# Patient Record
Sex: Female | Born: 1955 | Race: Black or African American | Hispanic: No | Marital: Single | Smoking: Never smoker
Health system: Southern US, Community
[De-identification: ages and names within clinical notes are randomized; demographics above are authoritative.]

## PROBLEM LIST (undated history)

## (undated) DIAGNOSIS — K219 Gastro-esophageal reflux disease without esophagitis: Secondary | ICD-10-CM

## (undated) DIAGNOSIS — E119 Type 2 diabetes mellitus without complications: Secondary | ICD-10-CM

## (undated) DIAGNOSIS — I10 Essential (primary) hypertension: Secondary | ICD-10-CM

## (undated) DIAGNOSIS — D649 Anemia, unspecified: Secondary | ICD-10-CM

## (undated) DIAGNOSIS — E7800 Pure hypercholesterolemia, unspecified: Secondary | ICD-10-CM

## (undated) HISTORY — PX: CHOLECYSTECTOMY: SHX55

## (undated) HISTORY — PX: BREAST EXCISIONAL BIOPSY: SUR124

## (undated) HISTORY — PX: ABDOMINAL HYSTERECTOMY: SHX81

---

## 2009-03-11 HISTORY — PX: GASTRIC BYPASS: SHX52

## 2011-01-06 DIAGNOSIS — K219 Gastro-esophageal reflux disease without esophagitis: Secondary | ICD-10-CM

## 2011-08-11 DIAGNOSIS — D649 Anemia, unspecified: Secondary | ICD-10-CM | POA: Insufficient documentation

## 2011-08-11 DIAGNOSIS — N289 Disorder of kidney and ureter, unspecified: Secondary | ICD-10-CM | POA: Insufficient documentation

## 2011-11-14 DIAGNOSIS — M549 Dorsalgia, unspecified: Secondary | ICD-10-CM | POA: Insufficient documentation

## 2011-11-14 DIAGNOSIS — R200 Anesthesia of skin: Secondary | ICD-10-CM | POA: Insufficient documentation

## 2012-10-09 ENCOUNTER — Ambulatory Visit: Payer: Self-pay | Admitting: Family Medicine

## 2013-04-17 DIAGNOSIS — I10 Essential (primary) hypertension: Secondary | ICD-10-CM | POA: Insufficient documentation

## 2013-04-17 DIAGNOSIS — M6530 Trigger finger, unspecified finger: Secondary | ICD-10-CM | POA: Insufficient documentation

## 2013-10-24 ENCOUNTER — Ambulatory Visit: Payer: Self-pay | Admitting: Physician Assistant

## 2013-11-29 DIAGNOSIS — E119 Type 2 diabetes mellitus without complications: Secondary | ICD-10-CM | POA: Insufficient documentation

## 2014-11-25 ENCOUNTER — Other Ambulatory Visit: Payer: Self-pay | Admitting: Family Medicine

## 2014-11-25 DIAGNOSIS — Z1231 Encounter for screening mammogram for malignant neoplasm of breast: Secondary | ICD-10-CM

## 2014-11-26 ENCOUNTER — Ambulatory Visit
Admission: RE | Admit: 2014-11-26 | Discharge: 2014-11-26 | Disposition: A | Discharge status: Home / Self Care | Payer: BC Managed Care – PPO | Source: Ambulatory Visit | Attending: Family Medicine | Admitting: Family Medicine

## 2014-11-26 DIAGNOSIS — Z1231 Encounter for screening mammogram for malignant neoplasm of breast: Secondary | ICD-10-CM

## 2015-02-17 ENCOUNTER — Emergency Department
Admission: EM | Admit: 2015-02-17 | Discharge: 2015-02-18 | Disposition: A | Discharge status: Home / Self Care | Payer: BC Managed Care – PPO | Attending: Emergency Medicine | Admitting: Emergency Medicine

## 2015-02-17 ENCOUNTER — Encounter: Payer: Self-pay | Admitting: *Deleted

## 2015-02-17 DIAGNOSIS — I10 Essential (primary) hypertension: Secondary | ICD-10-CM | POA: Diagnosis not present

## 2015-02-17 DIAGNOSIS — K529 Noninfective gastroenteritis and colitis, unspecified: Secondary | ICD-10-CM

## 2015-02-17 DIAGNOSIS — Z880 Allergy status to penicillin: Secondary | ICD-10-CM | POA: Diagnosis not present

## 2015-02-17 DIAGNOSIS — R197 Diarrhea, unspecified: Secondary | ICD-10-CM | POA: Diagnosis present

## 2015-02-17 LAB — CBC
HCT: 40.9 % (ref 35.0–47.0)
HEMOGLOBIN: 13.3 g/dL (ref 12.0–16.0)
MCH: 26.8 pg (ref 26.0–34.0)
MCHC: 32.6 g/dL (ref 32.0–36.0)
MCV: 82.1 fL (ref 80.0–100.0)
Platelets: 362 10*3/uL (ref 150–440)
RBC: 4.98 MIL/uL (ref 3.80–5.20)
RDW: 13.5 % (ref 11.5–14.5)
WBC: 10.7 10*3/uL (ref 3.6–11.0)

## 2015-02-17 NOTE — ED Notes (Signed)
Pt reports diarrhea for 1 week.  Denies abd pain.  Pt also reports n/v today.  Seen at [facility] today and was started on zithromax and zofran tonight.  Pt states she is worse tonight after going to urgent care today.

## 2015-02-18 LAB — LIPASE, BLOOD: Lipase: 37 U/L (ref 11–51)

## 2015-02-18 LAB — COMPREHENSIVE METABOLIC PANEL
ALK PHOS: 84 U/L (ref 38–126)
ALT: 15 U/L (ref 14–54)
ANION GAP: 8 (ref 5–15)
AST: 20 U/L (ref 15–41)
Albumin: 4.8 g/dL (ref 3.5–5.0)
BILIRUBIN TOTAL: 0.9 mg/dL (ref 0.3–1.2)
BUN: 47 mg/dL — ABNORMAL HIGH (ref 6–20)
CALCIUM: 9.5 mg/dL (ref 8.9–10.3)
CO2: 17 mmol/L — ABNORMAL LOW (ref 22–32)
Chloride: 110 mmol/L (ref 101–111)
Creatinine, Ser: 1.99 mg/dL — ABNORMAL HIGH (ref 0.44–1.00)
GFR, EST AFRICAN AMERICAN: 31 mL/min — AB (ref >60)
GFR, EST NON AFRICAN AMERICAN: 27 mL/min — AB (ref >60)
Glucose, Bld: 233 mg/dL — ABNORMAL HIGH (ref 65–99)
Potassium: 3.7 mmol/L (ref 3.5–5.1)
SODIUM: 135 mmol/L (ref 135–145)
TOTAL PROTEIN: 8.8 g/dL — AB (ref 6.5–8.1)

## 2015-02-18 LAB — C DIFFICILE QUICK SCREEN W PCR REFLEX
C DIFFICLE (CDIFF) ANTIGEN: NEGATIVE
C Diff interpretation: NEGATIVE
C Diff toxin: NEGATIVE

## 2015-02-18 MED ORDER — METRONIDAZOLE 500 MG PO TABS
500.0000 mg | ORAL_TABLET | Freq: Once | ORAL | Status: AC
  Administered 2015-02-18: 500 mg via ORAL
  Filled 2015-02-18: qty 1

## 2015-02-18 MED ORDER — SODIUM CHLORIDE 0.9 % IV BOLUS (SEPSIS)
1000.0000 mL | Freq: Once | INTRAVENOUS | Status: AC
  Administered 2015-02-18: 1000 mL via INTRAVENOUS

## 2015-02-18 MED ORDER — CIPROFLOXACIN HCL 500 MG PO TABS
500.0000 mg | ORAL_TABLET | Freq: Once | ORAL | Status: AC
  Administered 2015-02-18: 500 mg via ORAL
  Filled 2015-02-18: qty 1

## 2015-02-18 MED ORDER — CIPROFLOXACIN HCL 500 MG PO TABS
500.0000 mg | ORAL_TABLET | Freq: Two times a day (BID) | ORAL | Status: DC
Start: 2015-02-18 — End: 2015-06-22

## 2015-02-18 MED ORDER — ONDANSETRON HCL 4 MG/2ML IJ SOLN
4.0000 mg | Freq: Once | INTRAMUSCULAR | Status: AC
  Administered 2015-02-18: 4 mg via INTRAVENOUS
  Filled 2015-02-18: qty 2

## 2015-02-18 MED ORDER — PROMETHAZINE HCL 12.5 MG PO TABS: 12.5000 mg | ORAL_TABLET | Freq: Four times a day (QID) | ORAL | Status: DC | PRN

## 2015-02-18 MED ORDER — METRONIDAZOLE 500 MG PO TABS: 500.0000 mg | ORAL_TABLET | Freq: Two times a day (BID) | ORAL | Status: DC

## 2015-02-18 NOTE — Discharge Instructions (Signed)
°  Colitis °Colitis is inflammation of the colon. Colitis may last a short time (acute) or it may last a long time (chronic). °CAUSES °This condition may be caused by: °· Viruses. °· Bacteria. °· Reactions to medicine. °· Certain autoimmune diseases, such as Crohn disease or ulcerative colitis. °SYMPTOMS °Symptoms of this condition include: °· Diarrhea. °· Passing bloody or tarry stool. °· Pain. °· Fever. °· Vomiting. °· Tiredness (fatigue). °· Weight loss. °· Bloating. °· Sudden increase in abdominal pain. °· Having fewer bowel movements than usual. °DIAGNOSIS °This condition is diagnosed with a stool test or a blood test. You may also have other tests, including X-rays, a CT scan, or a colonoscopy. °TREATMENT °Treatment may include: °· Resting the bowel. This involves not eating or drinking for a period of time. °· Fluids that are given through an IV tube. °· Medicine for pain and diarrhea. °· Antibiotic medicines. °· Cortisone medicines. °· Surgery. °HOME CARE INSTRUCTIONS °Eating and Drinking °· Follow instructions from your health care provider about eating or drinking restrictions. °· Drink enough fluid to keep your urine clear or pale yellow. °· Work with a dietitian to determine which foods cause your condition to flare up. °· Avoid foods that cause flare-ups. °· Eat a well-balanced diet. °Medicines °· Take over-the-counter and prescription medicines only as told by your health care provider. °· If you were prescribed an antibiotic medicine, take it as told by your health care provider. Do not stop taking the antibiotic even if you start to feel better. °General Instructions °· Keep all follow-up visits as told by your health care provider. This is important. °SEEK MEDICAL CARE IF: °· Your symptoms do not go away. °· You develop new symptoms. °SEEK IMMEDIATE MEDICAL CARE IF: °· You have a fever that does not go away with treatment. °· You develop chills. °· You have extreme weakness, fainting, or  dehydration. °· You have repeated vomiting. °· You develop severe pain in your abdomen. °· You pass bloody or tarry stool. °  °This information is not intended to replace advice given to you by your health care provider. Make sure you discuss any questions you have with your health care provider. °  °Document Released: 05/05/2004 Document Revised: 12/17/2014 Document Reviewed: 07/21/2014 °Elsevier Interactive Patient Education ©2016 [employer] ° °

## 2015-02-18 NOTE — ED Provider Notes (Signed)
[facility] Emergency Department Provider Note  ____________________________________________  Time seen: 12:10 AM  I have reviewed the triage vital signs and the nursing notes.   HISTORY  Chief Complaint Diarrhea      HPI Kristie Miller is a 59 y.o. female presents with nonbloody diarrhea times one week accompanied by nausea and vomiting today. Patient was seen at [facility] today and prescribed azithromycin and Zofran. Patient unclear why she was given an antibiotic. Patient states that symptoms worsened tonight resulting in her presenting to the emergency department.     Past medical history Gastric bypass surgery Hypertension There are no active problems to display for this patient.   Past Surgical History  Procedure Laterality Date  . Breast biopsy Right 2009 or 2010    neg    No current outpatient prescriptions on file.  Allergies Penicillins  No family history on file.  Social History Social History  Substance Use Topics  . Smoking status: Never Smoker   . Smokeless tobacco: Not on file  . Alcohol Use: Yes    Review of Systems  Constitutional: Negative for fever. Eyes: Negative for visual changes. ENT: Negative for sore throat. Cardiovascular: Negative for chest pain. Respiratory: Negative for shortness of breath. Gastrointestinal: Negative for abdominal pain, vomiting and diarrhea. Genitourinary: Negative for dysuria. Musculoskeletal: Negative for back pain. Skin: Negative for rash. Neurological: Negative for headaches, focal weakness or numbness.   10-point ROS otherwise negative.  ____________________________________________   PHYSICAL EXAM:  VITAL SIGNS: ED Triage Vitals  Enc Vitals Group     BP 02/17/15 2306 120/67 mmHg     Pulse Rate 02/17/15 2306 128     Resp 02/17/15 2306 20     Temp 02/17/15 2306 98.1 F (36.7 C)     Temp Source 02/17/15 2306 Oral     SpO2 02/17/15 2306 100 %     Weight  02/17/15 2306 161 lb (73.029 kg)     Height 02/17/15 2306  (1.626 m)     Head Cir --      Peak Flow --      Pain Score 02/17/15 2308 8     Pain Loc --      Pain Edu? --      Excl. in GC? --      Constitutional: Alert and oriented. Well appearing and in no distress. Eyes: Conjunctivae are normal. PERRL. Normal extraocular movements. ENT   Head: Normocephalic and atraumatic.   Nose: No congestion/rhinnorhea.   Mouth/Throat: Mucous membranes are moist.   Neck: No stridor. Cardiovascular: Normal rate, regular rhythm. Normal and symmetric distal pulses are present in all extremities. No murmurs, rubs, or gallops. Respiratory: Normal respiratory effort without tachypnea nor retractions. Breath sounds are clear and equal bilaterally. No wheezes/rales/rhonchi. Gastrointestinal: Soft and nontender. No distention. There is no CVA tenderness. Genitourinary: deferred Musculoskeletal: Nontender with normal range of motion in all extremities. No joint effusions.  No lower extremity tenderness nor edema. Neurologic:  Normal speech and language. No gross focal neurologic deficits are appreciated. Speech is normal.  Skin:  Skin is warm, dry and intact. No rash noted. Psychiatric: Mood and affect are normal. Speech and behavior are normal. Patient exhibits appropriate insight and judgment.     INITIAL IMPRESSION / ASSESSMENT AND PLAN / ED COURSE  Pertinent labs & imaging results that were available during my care of the patient were reviewed by me and considered in my medical decision making (see chart for details).  Given history and physical exam concern for bacterial etiology of the patient's gastroenteritis given prolonged course. Patient received 2 L of IV normal saline here in the emergency department as she was clinically dehydrated which was confirmed with creatinine of 1.99 with a BUN of 47.  ____________________________________________   FINAL CLINICAL IMPRESSION(S) /  ED DIAGNOSES  Final diagnoses:  Acute gastroenteritis      Darci Currentandolph N Albena Comes, MD 02/18/15 [phone]

## 2015-02-18 NOTE — ED Notes (Signed)
Dr. Brown at bedside

## 2015-02-18 NOTE — ED Notes (Signed)
Discharge teaching done with patient.  Pt verbalized understanding.  No questions or concerns at this time.  Pt in NAD.  No nausea at this time.  Pt took belongings upon discharge.  No items left in ED.

## 2015-05-11 ENCOUNTER — Telehealth: Payer: Self-pay | Admitting: Gastroenterology

## 2015-05-11 NOTE — Telephone Encounter (Signed)
colonoscopy

## 2015-05-22 NOTE — Telephone Encounter (Signed)
LVM for pt to return my call.

## 2015-05-25 ENCOUNTER — Telehealth: Payer: Self-pay | Admitting: Gastroenterology

## 2015-05-25 NOTE — Telephone Encounter (Signed)
Triage and use extension [id number] with the number

## 2015-05-29 ENCOUNTER — Telehealth: Payer: Self-pay

## 2015-05-29 ENCOUNTER — Other Ambulatory Visit: Payer: Self-pay

## 2015-05-29 DIAGNOSIS — E785 Hyperlipidemia, unspecified: Secondary | ICD-10-CM | POA: Insufficient documentation

## 2015-05-29 NOTE — Telephone Encounter (Signed)
Gastroenterology Pre-Procedure Review  Request Date: 06/29/15 Requesting Physician: Dr. Fabienne Bruns  PATIENT REVIEW QUESTIONS: The patient responded to the following health history questions as indicated:    1. Are you having any GI issues? no 2. Do you have a personal history of Polyps? no 3. Do you have a family history of Colon Cancer or Polyps? no 4. Diabetes Mellitus? yes (Type 2) 5. Joint replacements in the past 12 months?no 6. Major health problems in the past 3 months?no 7. Any artificial heart valves, MVP, or defibrillator?no    MEDICATIONS & ALLERGIES:    Patient reports the following regarding taking any anticoagulation/antiplatelet therapy:   Plavix, Coumadin, Eliquis, Xarelto, Lovenox, Pradaxa, Brilinta, or Effient? no Aspirin? no  Patient confirms/reports the following medications:  Current Outpatient Prescriptions  Medication Sig Dispense Refill  . azithromycin (ZITHROMAX) 500 MG tablet Take 2 tablets by mouth daily.    . benazepril (LOTENSIN) 40 MG tablet Take 1 tablet by mouth daily.    . ciprofloxacin (CIPRO) 500 MG tablet Take 1 tablet (500 mg total) by mouth 2 (two) times daily. 20 tablet 0  . metFORMIN (GLUCOPHAGE) 1000 MG tablet Take 1 tablet by mouth 2 (two) times daily.    . metroNIDAZOLE (FLAGYL) 500 MG tablet Take 1 tablet (500 mg total) by mouth 2 (two) times daily. 20 tablet 0  . promethazine (PHENERGAN) 12.5 MG tablet Take 1 tablet (12.5 mg total) by mouth every 6 (six) hours as needed for nausea or vomiting. 30 tablet 0   No current facility-administered medications for this visit.    Patient confirms/reports the following allergies:  Allergies  Allergen Reactions  . Penicillins Hives    No orders of the defined types were placed in this encounter.    AUTHORIZATION INFORMATION Primary Insurance: 1D#: Group #:  Secondary Insurance: 1D#: Group #:  SCHEDULE INFORMATION: Date: 06/29/15 Time: Location: [facility]

## 2015-05-29 NOTE — Telephone Encounter (Signed)
Pt scheduled at [facility] on Monday, March 20th for screening colonoscopy. Please precert insurance.

## 2015-06-22 ENCOUNTER — Encounter: Payer: Self-pay | Admitting: *Deleted

## 2015-07-03 NOTE — Discharge Instructions (Signed)

## 2015-07-06 ENCOUNTER — Encounter
Admission: RE | Disposition: A | Discharge status: Home / Self Care | Payer: Self-pay | Source: Ambulatory Visit | Attending: Gastroenterology

## 2015-07-06 ENCOUNTER — Ambulatory Visit: Payer: BC Managed Care – PPO | Admitting: Anesthesiology

## 2015-07-06 ENCOUNTER — Ambulatory Visit
Admission: RE | Admit: 2015-07-06 | Discharge: 2015-07-06 | Disposition: A | Discharge status: Home / Self Care | Payer: BC Managed Care – PPO | Source: Ambulatory Visit | Attending: Gastroenterology | Admitting: Gastroenterology

## 2015-07-06 DIAGNOSIS — I10 Essential (primary) hypertension: Secondary | ICD-10-CM | POA: Insufficient documentation

## 2015-07-06 DIAGNOSIS — E7800 Pure hypercholesterolemia, unspecified: Secondary | ICD-10-CM | POA: Diagnosis not present

## 2015-07-06 DIAGNOSIS — E119 Type 2 diabetes mellitus without complications: Secondary | ICD-10-CM | POA: Diagnosis not present

## 2015-07-06 DIAGNOSIS — Z7984 Long term (current) use of oral hypoglycemic drugs: Secondary | ICD-10-CM | POA: Insufficient documentation

## 2015-07-06 DIAGNOSIS — Z9884 Bariatric surgery status: Secondary | ICD-10-CM | POA: Diagnosis not present

## 2015-07-06 DIAGNOSIS — Z1211 Encounter for screening for malignant neoplasm of colon: Secondary | ICD-10-CM | POA: Insufficient documentation

## 2015-07-06 DIAGNOSIS — Z9049 Acquired absence of other specified parts of digestive tract: Secondary | ICD-10-CM | POA: Insufficient documentation

## 2015-07-06 DIAGNOSIS — Z90710 Acquired absence of both cervix and uterus: Secondary | ICD-10-CM | POA: Insufficient documentation

## 2015-07-06 DIAGNOSIS — D649 Anemia, unspecified: Secondary | ICD-10-CM | POA: Insufficient documentation

## 2015-07-06 DIAGNOSIS — Z79899 Other long term (current) drug therapy: Secondary | ICD-10-CM | POA: Insufficient documentation

## 2015-07-06 DIAGNOSIS — K641 Second degree hemorrhoids: Secondary | ICD-10-CM | POA: Diagnosis not present

## 2015-07-06 DIAGNOSIS — Z7982 Long term (current) use of aspirin: Secondary | ICD-10-CM | POA: Diagnosis not present

## 2015-07-06 DIAGNOSIS — K219 Gastro-esophageal reflux disease without esophagitis: Secondary | ICD-10-CM | POA: Diagnosis not present

## 2015-07-06 HISTORY — DX: Essential (primary) hypertension: I10

## 2015-07-06 HISTORY — DX: Pure hypercholesterolemia, unspecified: E78.00

## 2015-07-06 HISTORY — PX: COLONOSCOPY WITH PROPOFOL: SHX5780

## 2015-07-06 HISTORY — DX: Gastro-esophageal reflux disease without esophagitis: K21.9

## 2015-07-06 HISTORY — DX: Type 2 diabetes mellitus without complications: E11.9

## 2015-07-06 HISTORY — DX: Anemia, unspecified: D64.9

## 2015-07-06 LAB — GLUCOSE, CAPILLARY
GLUCOSE-CAPILLARY: 127 mg/dL — AB (ref 65–99)
GLUCOSE-CAPILLARY: 149 mg/dL — AB (ref 65–99)

## 2015-07-06 SURGERY — COLONOSCOPY WITH PROPOFOL
Anesthesia: Monitor Anesthesia Care | Wound class: Contaminated

## 2015-07-06 MED ORDER — LIDOCAINE HCL (CARDIAC) 20 MG/ML IV SOLN
INTRAVENOUS | Status: DC | PRN
  Administered 2015-07-06: 50 mg via INTRAVENOUS

## 2015-07-06 MED ORDER — PROPOFOL 10 MG/ML IV BOLUS
INTRAVENOUS | Status: DC | PRN
  Administered 2015-07-06: 50 mg via INTRAVENOUS
  Administered 2015-07-06: 30 mg via INTRAVENOUS
  Administered 2015-07-06: 100 mg via INTRAVENOUS
  Administered 2015-07-06 (×2): 50 mg via INTRAVENOUS
  Administered 2015-07-06 (×2): 30 mg via INTRAVENOUS
  Administered 2015-07-06 (×2): 20 mg via INTRAVENOUS

## 2015-07-06 MED ORDER — STERILE WATER FOR IRRIGATION IR SOLN
Status: DC | PRN
  Administered 2015-07-06: 08:00:00

## 2015-07-06 MED ORDER — LACTATED RINGERS IV SOLN
INTRAVENOUS | Status: DC
  Administered 2015-07-06: 08:00:00 via INTRAVENOUS

## 2015-07-06 SURGICAL SUPPLY — 21 items
CANISTER SUCT 1200ML W/VALVE (MISCELLANEOUS) ×3 IMPLANT
CLIP HMST 235XBRD CATH ROT (MISCELLANEOUS) IMPLANT
CLIP RESOLUTION 360 11X235 (MISCELLANEOUS)
FCP ESCP3.2XJMB 240X2.8X (MISCELLANEOUS)
FORCEPS BIOP RAD 4 LRG CAP 4 (CUTTING FORCEPS) IMPLANT
FORCEPS BIOP RJ4 240 W/NDL (MISCELLANEOUS)
FORCEPS ESCP3.2XJMB 240X2.8X (MISCELLANEOUS) IMPLANT
GOWN CVR UNV OPN BCK APRN NK (MISCELLANEOUS) ×2 IMPLANT
GOWN ISOL THUMB LOOP REG UNIV (MISCELLANEOUS) ×4
INJECTOR VARIJECT VIN23 (MISCELLANEOUS) IMPLANT
KIT DEFENDO VALVE AND CONN (KITS) IMPLANT
KIT ENDO PROCEDURE OLY (KITS) ×3 IMPLANT
MARKER SPOT ENDO TATTOO 5ML (MISCELLANEOUS) IMPLANT
PAD GROUND ADULT SPLIT (MISCELLANEOUS) IMPLANT
PROBE APC STR FIRE (PROBE) ×3 IMPLANT
SNARE SHORT THROW 13M SML OVAL (MISCELLANEOUS) IMPLANT
SNARE SHORT THROW 30M LRG OVAL (MISCELLANEOUS) IMPLANT
SPOT EX ENDOSCOPIC TATTOO (MISCELLANEOUS)
VARIJECT INJECTOR VIN23 (MISCELLANEOUS)
WATER STERILE IRR 250ML POUR (IV SOLUTION) ×3 IMPLANT
WIDE-EYE POLYPTRAP (MISCELLANEOUS) IMPLANT

## 2015-07-06 NOTE — Anesthesia Procedure Notes (Signed)
Procedure Name: MAC Date/Time: 07/06/2015 7:58 AM Performed by: Maree KrabbeWARREN, Lanny Donoso Pre-anesthesia Checklist: Patient identified, Emergency Drugs available, Suction available, Timeout performed and Patient being monitored Patient Re-evaluated:Patient Re-evaluated prior to inductionOxygen Delivery Method: Nasal cannula Placement Confirmation: positive ETCO2

## 2015-07-06 NOTE — Transfer of Care (Signed)
Immediate Anesthesia Transfer of Care Note  Patient: Kristie Miller  Procedure(s) Performed: Procedure(s) with comments: COLONOSCOPY WITH PROPOFOL (N/A) - Diabetic - oral meds  Patient Location: PACU  Anesthesia Type: MAC  Level of Consciousness: awake, alert  and patient cooperative  Airway and Oxygen Therapy: Patient Spontanous Breathing and Patient connected to supplemental oxygen  Post-op Assessment: Post-op Vital signs reviewed, Patient's Cardiovascular Status Stable, Respiratory Function Stable, Patent Airway and No signs of Nausea or vomiting  Post-op Vital Signs: Reviewed and stable  Complications: No apparent anesthesia complications

## 2015-07-06 NOTE — H&P (Signed)
  [facility]  [address] [place], [place] [address] Phone: [phone] Fax : [phone]  Primary Care Physician:  Cathie HoopsWENS, LEANNE WHALEY, PA Primary Gastroenterologist:  Dr. Servando SnareWohl  Pre-Procedure History & Physical: HPI:  Kristie ScheuermannSharon D Miller is a 60 y.o. female is here for a screening colonoscopy.   Past Medical History  Diagnosis Date  . Hypertension   . Anemia   . GERD (gastroesophageal reflux disease)   . Diabetes mellitus without complication (HCC)   . Hypercholesteremia     Past Surgical History  Procedure Laterality Date  . Breast biopsy Right 2009 or 2010    neg  . Cholecystectomy    . Abdominal hysterectomy    . Gastric bypass  03/2009    Prior to Admission medications   Medication Sig Start Date End Date Taking? Authorizing Provider  aspirin 81 MG tablet Take 81 mg by mouth daily.   Yes Historical Provider, MD  Cholecalciferol (VITAMIN D-3 PO) Take by mouth daily.   Yes Historical Provider, MD  CINNAMON PO Take by mouth daily.   Yes Historical Provider, MD  Docusate Calcium (STOOL SOFTENER PO) Take by mouth daily.   Yes Historical Provider, MD  FERROUS SULFATE PO Take by mouth daily.   Yes Historical Provider, MD  Multiple Vitamins-Minerals (MULTIVITAMIN PO) Take by mouth daily.   Yes Historical Provider, MD  benazepril (LOTENSIN) 40 MG tablet Take 1 tablet by mouth daily.    Historical Provider, MD  metFORMIN (GLUCOPHAGE) 1000 MG tablet Take 1 tablet by mouth 2 (two) times daily.    Historical Provider, MD  omeprazole (PRILOSEC) 20 MG capsule Take 20 mg by mouth daily.    Historical Provider, MD    Allergies as of 05/29/2015 - never reviewed  Allergen Reaction Noted  . Penicillins Hives 02/17/2015    History reviewed. No pertinent family history.  Social History   Social History  . Marital Status: Single    Spouse Name: N/A  . Number of Children: N/A  . Years of Education: N/A   Occupational History  . Not on file.   Social  History Main Topics  . Smoking status: Never Smoker   . Smokeless tobacco: Not on file  . Alcohol Use: 0.6 oz/week    1 Shots of liquor per week  . Drug Use: Not on file  . Sexual Activity: Not on file   Other Topics Concern  . Not on file   Social History Narrative    Review of Systems: See HPI, otherwise negative ROS  Physical Exam: Ht 5\' 4"  (1.626 m)  Wt 161 lb (73.029 kg)  BMI 27.62 kg/m2 General:   Alert,  pleasant and cooperative in NAD Head:  Normocephalic and atraumatic. Neck:  Supple; no masses or thyromegaly. Lungs:  Clear throughout to auscultation.    Heart:  Regular rate and rhythm. Abdomen:  Soft, nontender and nondistended. Normal bowel sounds, without guarding, and without rebound.   Neurologic:  Alert and  oriented x4;  grossly normal neurologically.  Impression/Plan: Kristie Miller is now here to undergo a screening colonoscopy.  Risks, benefits, and alternatives regarding colonoscopy have been reviewed with the patient.  Questions have been answered.  All parties agreeable.

## 2015-07-06 NOTE — Anesthesia Preprocedure Evaluation (Signed)
Anesthesia Evaluation  Patient identified by MRN, date of birth, ID band  Reviewed: Allergy & Precautions, H&P , NPO status , Patient's Chart, lab work & pertinent test results  Airway Mallampati: II  TM Distance: >3 FB Neck ROM: full    Dental no notable dental hx.    Pulmonary    Pulmonary exam normal       Cardiovascular hypertension, Rhythm:regular Rate:Normal     Neuro/Psych    GI/Hepatic GERD-  ,  Endo/Other  diabetes  Renal/GU      Musculoskeletal   Abdominal   Peds  Hematology   Anesthesia Other Findings   Reproductive/Obstetrics                             Anesthesia Physical Anesthesia Plan  ASA: II  Anesthesia Plan: MAC   Post-op Pain Management:    Induction:   Airway Management Planned:   Additional Equipment:   Intra-op Plan:   Post-operative Plan:   Informed Consent: I have reviewed the patients History and Physical, chart, labs and discussed the procedure including the risks, benefits and alternatives for the proposed anesthesia with the patient or authorized representative who has indicated his/her understanding and acceptance.     Plan Discussed with: CRNA  Anesthesia Plan Comments:         Anesthesia Quick Evaluation  

## 2015-07-06 NOTE — Anesthesia Postprocedure Evaluation (Signed)
Anesthesia Post Note  Patient: Garnette ScheuermannSharon D Ayad  Procedure(s) Performed: Procedure(s) (LRB): COLONOSCOPY WITH PROPOFOL (N/A)  Patient location during evaluation: PACU Anesthesia Type: MAC Level of consciousness: awake and alert and oriented Pain management: satisfactory to patient Vital Signs Assessment: post-procedure vital signs reviewed and stable Respiratory status: spontaneous breathing, nonlabored ventilation and respiratory function stable Cardiovascular status: blood pressure returned to baseline and stable Postop Assessment: Adequate PO intake and No signs of nausea or vomiting Anesthetic complications: no    Cherly BeachStella, Kaysha Parsell J

## 2015-07-06 NOTE — Op Note (Signed)
[facility] Gastroenterology Patient Name: Kristie Miller Procedure Date: 07/06/2015 7:32 AM MRN: [id number] Account #: [id number] Date of Birth: 07/10/1955 Admit Type: Outpatient Age: [age] [facility] Gender: Female Note Status: Finalized Procedure:            Colonoscopy Indications:          Screening for colorectal malignant neoplasm Providers:            Midge Miniumarren Markail Diekman, MD Referring MD:         Teola BradleyLeanne W. Owens (Referring MD) Medicines:            Propofol per Anesthesia Complications:        No immediate complications. Procedure:            Pre-Anesthesia Assessment:                       - Prior to the procedure, a History and Physical was                        performed, and patient medications and allergies were                        reviewed. The patient's tolerance of previous                        anesthesia was also reviewed. The risks and benefits of                        the procedure and the sedation options and risks were                        discussed with the patient. All questions were                        answered, and informed consent was obtained. Prior                        Anticoagulants: The patient has taken no previous                        anticoagulant or antiplatelet agents. ASA Grade                        Assessment: II - A patient with mild systemic disease.                        After reviewing the risks and benefits, the patient was                        deemed in satisfactory condition to undergo the                        procedure.                       After obtaining informed consent, the colonoscope was                        passed under direct vision. Throughout the procedure,  the patient's blood pressure, pulse, and oxygen                        saturations were monitored continuously. The Olympus CF                        H180AL colonoscope (S#: [id number]) was introduced through                    the anus and advanced to the the cecum, identified by                        appendiceal orifice and ileocecal valve. The                        colonoscopy was performed without difficulty. The                        patient tolerated the procedure well. The quality of                        the bowel preparation was poor. Findings:      The perianal and digital rectal examinations were normal.      A large amount of liquid stool was found in the entire colon, making       visualization difficult.      Non-bleeding internal hemorrhoids were found during retroflexion. The       hemorrhoids were Grade II (internal hemorrhoids that prolapse but reduce       spontaneously). Impression:           - Preparation of the colon was poor.                       - Stool in the entire examined colon.                       - Non-bleeding internal hemorrhoids.                       - No specimens collected. Recommendation:       - Repeat colonoscopy in 5 years because the bowel                        preparation was suboptimal. Procedure Code(s):    --- Professional ---                       [id number], Colonoscopy, flexible; diagnostic, including                        collection of specimen(s) by brushing or washing, when                        performed (separate procedure) Diagnosis Code(s):    --- Professional ---                       Z12.11, Encounter for screening for malignant neoplasm                        of colon  K64.1, Second degree hemorrhoids CPT copyright 2016 American Medical Association. All rights reserved. The codes documented in this report are preliminary and upon coder review may  be revised to meet current compliance requirements. Midge Minium, MD 07/06/2015 8:22:08 AM This report has been signed electronically. Number of Addenda: 0 Note Initiated On: 07/06/2015 7:32 AM Scope Withdrawal Time: 0 hours 9 minutes 17 seconds  Total Procedure  Duration: 0 hours 16 minutes 38 seconds       [facility]

## 2015-07-07 ENCOUNTER — Encounter: Payer: Self-pay | Admitting: Gastroenterology

## 2016-02-18 ENCOUNTER — Other Ambulatory Visit: Payer: Self-pay | Admitting: Family Medicine

## 2016-02-18 DIAGNOSIS — Z1231 Encounter for screening mammogram for malignant neoplasm of breast: Secondary | ICD-10-CM

## 2016-03-02 ENCOUNTER — Ambulatory Visit
Admission: RE | Admit: 2016-03-02 | Discharge: 2016-03-02 | Disposition: A | Discharge status: Home / Self Care | Payer: BC Managed Care – PPO | Source: Ambulatory Visit | Attending: Family Medicine | Admitting: Family Medicine

## 2016-03-02 DIAGNOSIS — Z1231 Encounter for screening mammogram for malignant neoplasm of breast: Secondary | ICD-10-CM | POA: Diagnosis present

## 2017-01-23 ENCOUNTER — Other Ambulatory Visit: Payer: Self-pay | Admitting: Family Medicine

## 2017-01-23 DIAGNOSIS — Z1231 Encounter for screening mammogram for malignant neoplasm of breast: Secondary | ICD-10-CM

## 2017-02-20 ENCOUNTER — Ambulatory Visit
Admission: RE | Admit: 2017-02-20 | Discharge: 2017-02-20 | Disposition: A | Discharge status: Home / Self Care | Payer: BC Managed Care – PPO | Source: Ambulatory Visit | Attending: Family Medicine | Admitting: Family Medicine

## 2017-02-20 DIAGNOSIS — Z1231 Encounter for screening mammogram for malignant neoplasm of breast: Secondary | ICD-10-CM | POA: Diagnosis present

## 2018-01-15 ENCOUNTER — Other Ambulatory Visit: Payer: Self-pay | Admitting: Family Medicine

## 2018-01-15 DIAGNOSIS — Z1231 Encounter for screening mammogram for malignant neoplasm of breast: Secondary | ICD-10-CM

## 2018-02-19 ENCOUNTER — Encounter (INDEPENDENT_AMBULATORY_CARE_PROVIDER_SITE_OTHER): Payer: Self-pay

## 2018-02-19 ENCOUNTER — Ambulatory Visit
Admission: RE | Admit: 2018-02-19 | Discharge: 2018-02-19 | Disposition: A | Discharge status: Home / Self Care | Payer: BC Managed Care – PPO | Source: Ambulatory Visit | Attending: Family Medicine | Admitting: Family Medicine

## 2018-02-19 DIAGNOSIS — Z1231 Encounter for screening mammogram for malignant neoplasm of breast: Secondary | ICD-10-CM | POA: Diagnosis present

## 2019-02-04 ENCOUNTER — Other Ambulatory Visit: Payer: Self-pay | Admitting: Family Medicine

## 2019-02-04 DIAGNOSIS — Z1231 Encounter for screening mammogram for malignant neoplasm of breast: Secondary | ICD-10-CM

## 2019-02-25 ENCOUNTER — Ambulatory Visit
Admission: RE | Admit: 2019-02-25 | Discharge: 2019-02-25 | Disposition: A | Discharge status: Home / Self Care | Payer: BC Managed Care – PPO | Source: Ambulatory Visit | Attending: Family Medicine | Admitting: Family Medicine

## 2019-02-25 ENCOUNTER — Other Ambulatory Visit: Payer: Self-pay

## 2019-02-25 ENCOUNTER — Encounter (INDEPENDENT_AMBULATORY_CARE_PROVIDER_SITE_OTHER): Payer: Self-pay

## 2019-02-25 DIAGNOSIS — Z1231 Encounter for screening mammogram for malignant neoplasm of breast: Secondary | ICD-10-CM | POA: Insufficient documentation

## 2019-12-26 ENCOUNTER — Other Ambulatory Visit: Payer: Self-pay | Admitting: Family Medicine

## 2019-12-26 DIAGNOSIS — Z1231 Encounter for screening mammogram for malignant neoplasm of breast: Secondary | ICD-10-CM

## 2020-03-04 ENCOUNTER — Other Ambulatory Visit: Payer: Self-pay

## 2020-03-04 ENCOUNTER — Ambulatory Visit
Admission: RE | Admit: 2020-03-04 | Discharge: 2020-03-04 | Disposition: A | Discharge status: Home / Self Care | Payer: BC Managed Care – PPO | Source: Ambulatory Visit | Attending: Family Medicine | Admitting: Family Medicine

## 2020-03-04 DIAGNOSIS — Z1231 Encounter for screening mammogram for malignant neoplasm of breast: Secondary | ICD-10-CM | POA: Diagnosis not present

## 2021-03-17 ENCOUNTER — Other Ambulatory Visit: Payer: Self-pay | Admitting: Family Medicine

## 2021-03-17 DIAGNOSIS — Z1231 Encounter for screening mammogram for malignant neoplasm of breast: Secondary | ICD-10-CM

## 2021-03-30 ENCOUNTER — Ambulatory Visit: Payer: BC Managed Care – PPO

## 2021-04-08 ENCOUNTER — Ambulatory Visit
Admission: RE | Admit: 2021-04-08 | Discharge: 2021-04-08 | Disposition: A | Discharge status: Home / Self Care | Payer: BC Managed Care – PPO | Source: Ambulatory Visit | Attending: Family Medicine | Admitting: Family Medicine

## 2021-04-08 ENCOUNTER — Other Ambulatory Visit: Payer: Self-pay

## 2021-04-08 DIAGNOSIS — Z1231 Encounter for screening mammogram for malignant neoplasm of breast: Secondary | ICD-10-CM | POA: Insufficient documentation

## 2022-03-01 ENCOUNTER — Other Ambulatory Visit: Payer: Self-pay | Admitting: Family Medicine

## 2022-03-01 DIAGNOSIS — Z1231 Encounter for screening mammogram for malignant neoplasm of breast: Secondary | ICD-10-CM

## 2022-04-12 ENCOUNTER — Ambulatory Visit
Admission: RE | Admit: 2022-04-12 | Discharge: 2022-04-12 | Disposition: A | Discharge status: Home / Self Care | Payer: BC Managed Care – PPO | Source: Ambulatory Visit | Attending: Family Medicine | Admitting: Family Medicine

## 2022-04-12 DIAGNOSIS — Z1231 Encounter for screening mammogram for malignant neoplasm of breast: Secondary | ICD-10-CM | POA: Insufficient documentation

## 2023-04-03 ENCOUNTER — Other Ambulatory Visit: Payer: Self-pay | Admitting: Family Medicine

## 2023-04-03 DIAGNOSIS — Z1231 Encounter for screening mammogram for malignant neoplasm of breast: Secondary | ICD-10-CM

## 2023-04-18 ENCOUNTER — Ambulatory Visit
Admission: RE | Admit: 2023-04-18 | Discharge: 2023-04-18 | Disposition: A | Discharge status: Home / Self Care | Payer: Medicare PPO | Source: Ambulatory Visit | Attending: Family Medicine | Admitting: Family Medicine

## 2023-04-18 DIAGNOSIS — Z1231 Encounter for screening mammogram for malignant neoplasm of breast: Secondary | ICD-10-CM | POA: Insufficient documentation

## 2023-04-30 IMAGING — MG MM DIGITAL SCREENING BILAT W/ TOMO AND CAD
8 series · 8 of 24 positions shown · non-contrast
Comparison: Previous exam(s).

CLINICAL DATA: Screening.

EXAM:
DIGITAL SCREENING BILATERAL MAMMOGRAM WITH TOMOSYNTHESIS AND CAD
TECHNIQUE: Bilateral screening digital craniocaudal and mediolateral oblique
mammograms were obtained. Bilateral screening digital breast
tomosynthesis was performed. The images were evaluated with
computer-aided detection.

[R MLO synth-2D]
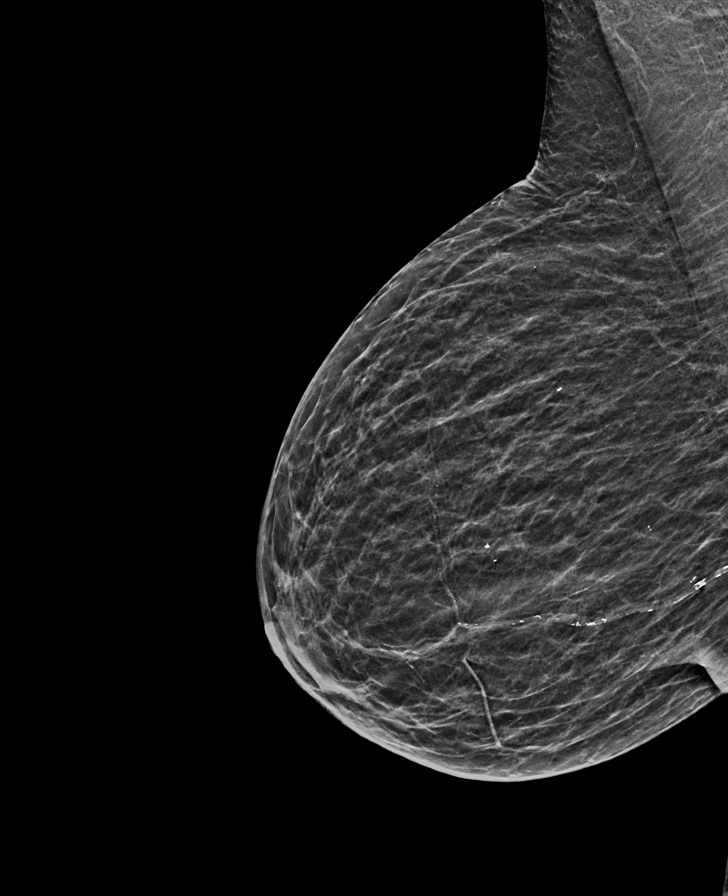

[R CC synth-2D]
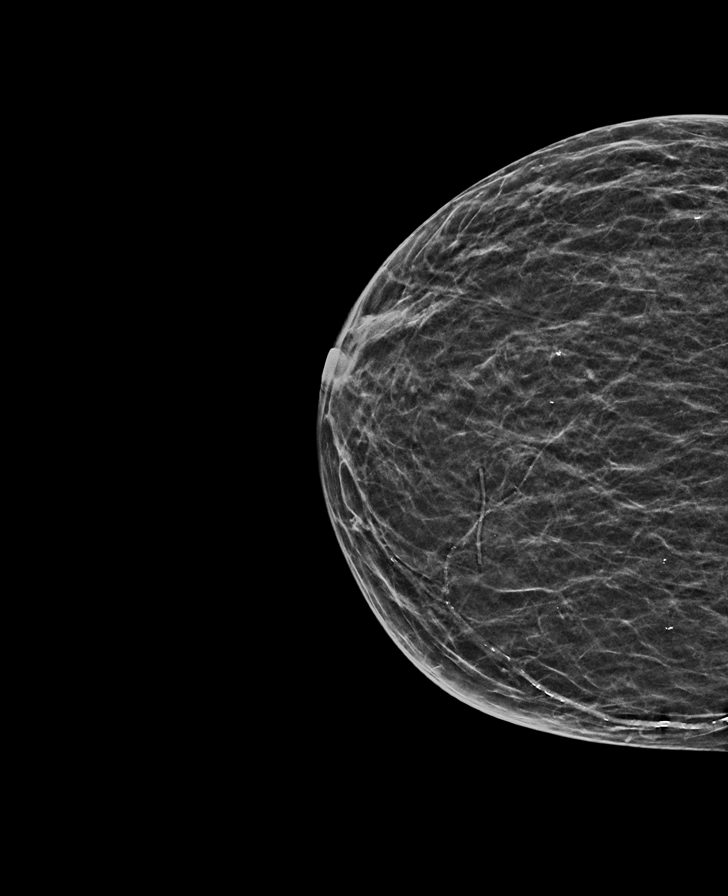

[L MLO synth-2D]
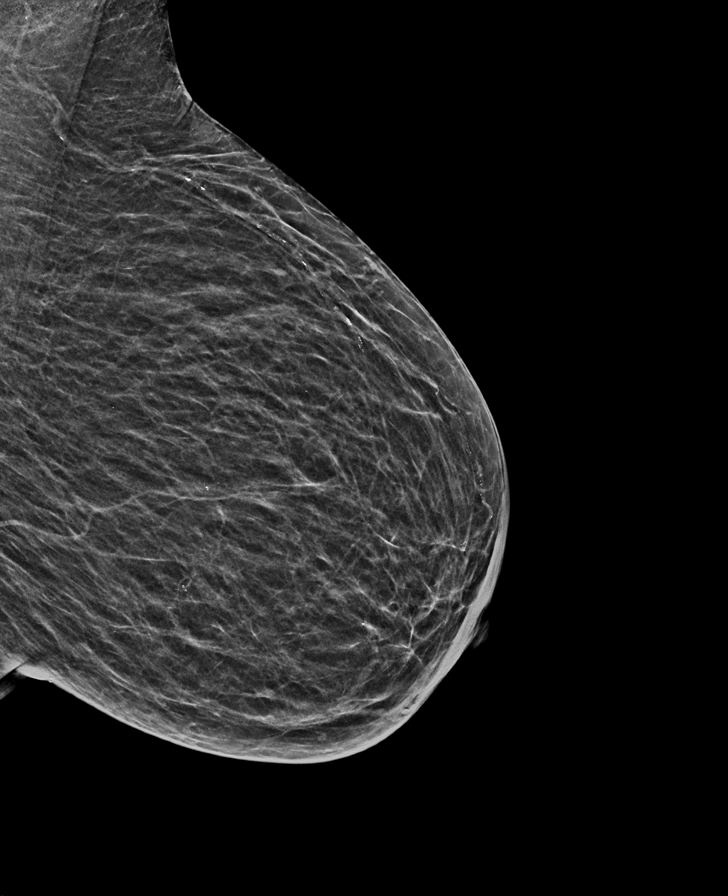

[L CC synth-2D]
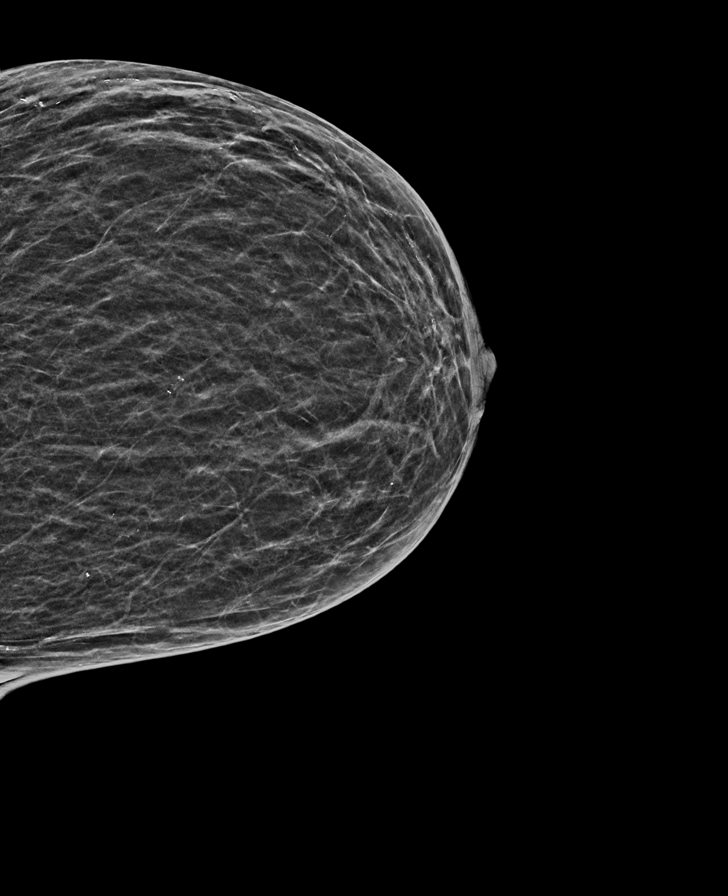

[R CC tomo · tomo slice 18/35.0]
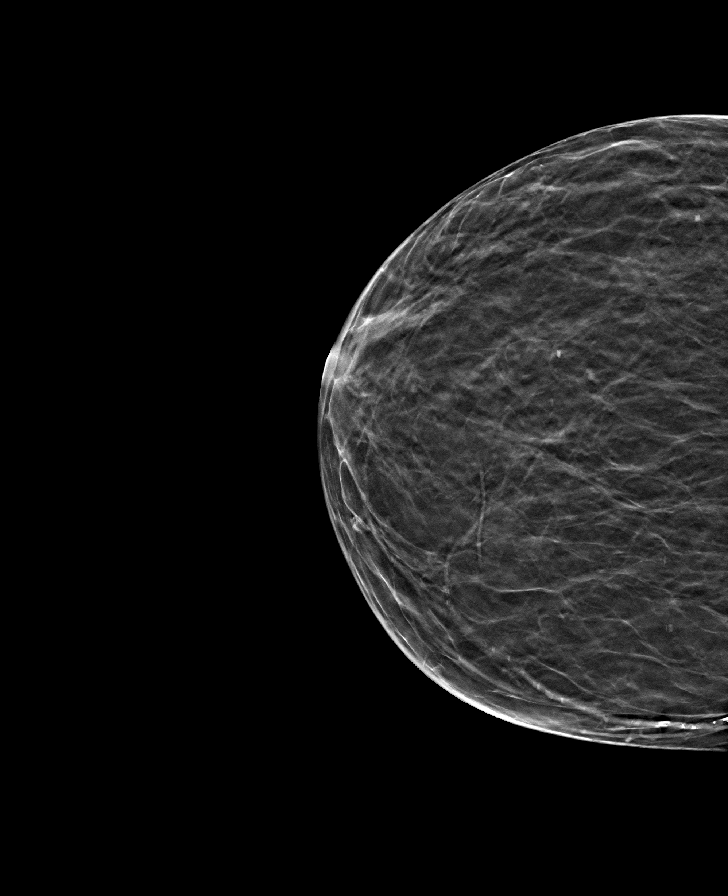

[L CC tomo · tomo slice 20/39.0]
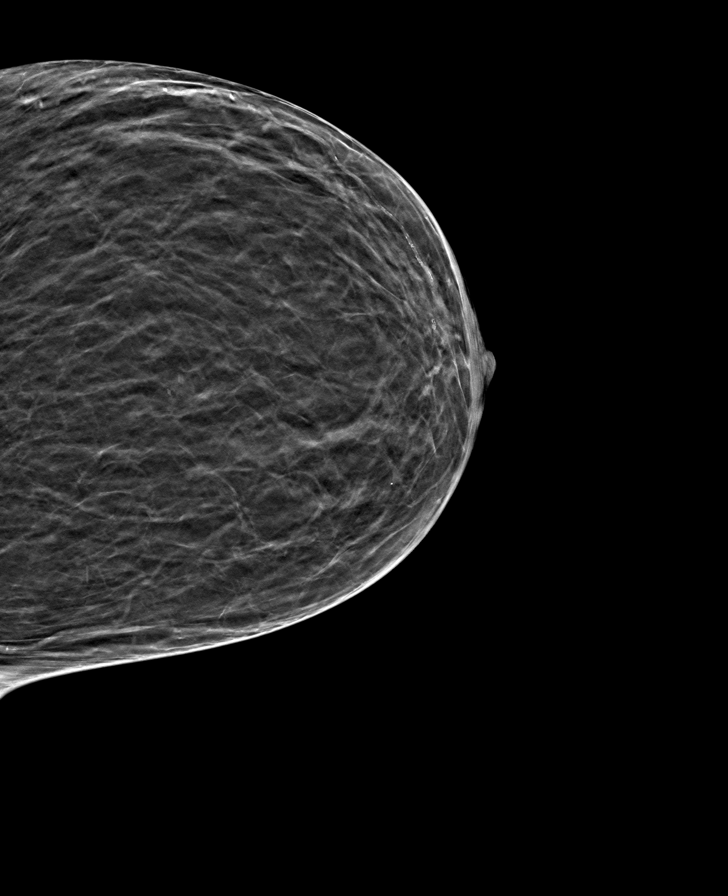

[L MLO tomo · tomo slice 21/41.0]
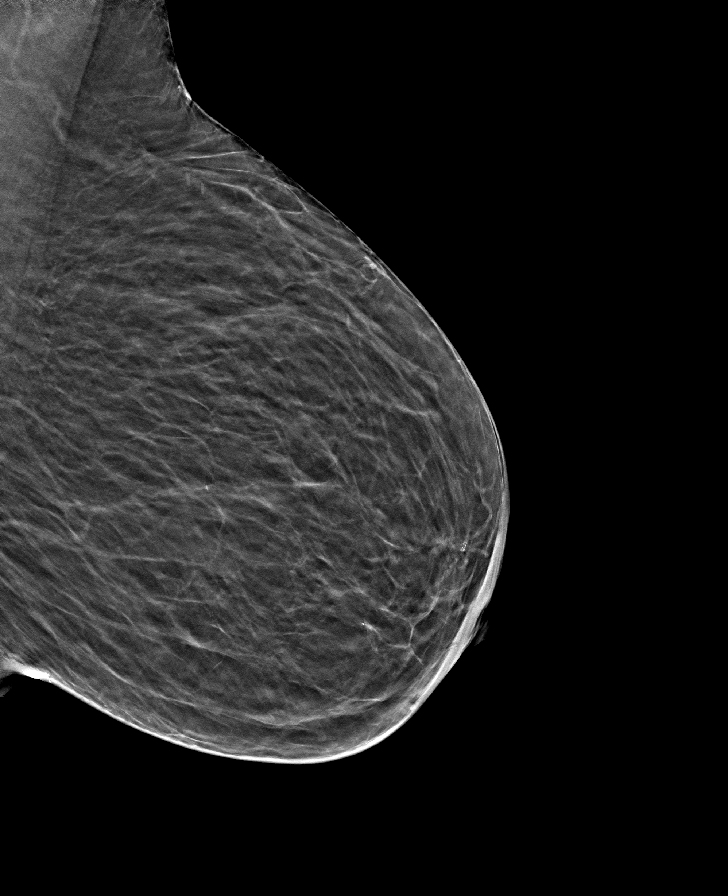

[R MLO tomo · tomo slice 21/40.0]
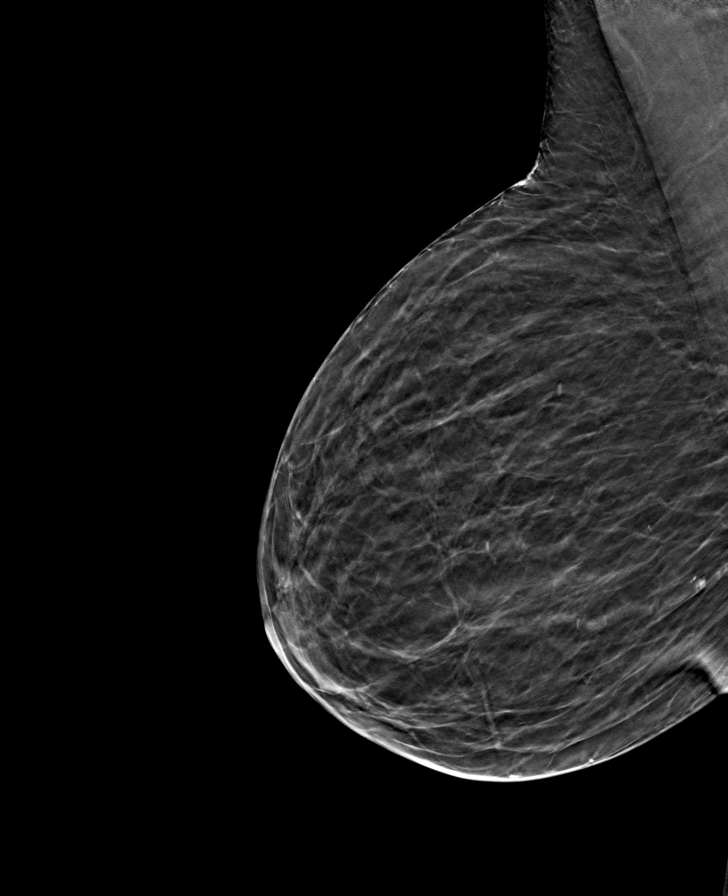

[8 of 24 positions shown; findings below may reference images not displayed]

ACR Breast Density Category b: There are scattered areas of
fibroglandular density.
FINDINGS: There are no findings suspicious for malignancy.
IMPRESSION: No mammographic evidence of malignancy. A result letter of this
screening mammogram will be mailed directly to the patient.

RECOMMENDATION:
Screening mammogram in one year. (Code:51-O-LD2)

BI-RADS CATEGORY  1: Negative.
